# Patient Record
Sex: Female | Born: 1980 | Hispanic: Yes | Marital: Single | Smoking: Never smoker
Health system: Southern US, Community
[De-identification: ages and names within clinical notes are randomized; demographics above are authoritative.]

## PROBLEM LIST (undated history)

## (undated) ENCOUNTER — Ambulatory Visit: Admission: EM | Source: Home / Self Care

## (undated) HISTORY — PX: CHOLECYSTECTOMY: SHX55

---

## 2009-03-06 ENCOUNTER — Emergency Department: Payer: Self-pay | Admitting: Unknown Physician Specialty

## 2010-02-17 ENCOUNTER — Emergency Department: Payer: Self-pay | Admitting: Unknown Physician Specialty

## 2010-11-17 IMAGING — US US OB < 14 WEEKS - US OB TV
1 series · 17 of 28 positions shown · non-contrast
Comparison: none

REASON FOR EXAM: painless vaginal bleeding, h/o placenta previa with
previous pregnancy
COMMENTS:

[Series 1: us ob < 14 weeks - us ob tv · 17 of 40 slices shown]
[im 1/40]
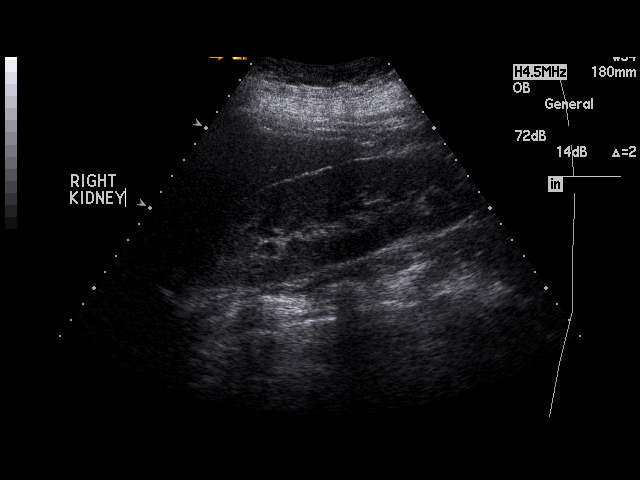
[im 3/40]
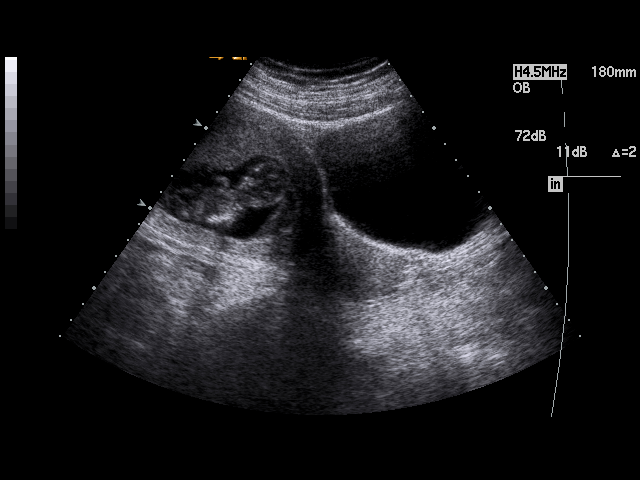
[im 6/40]
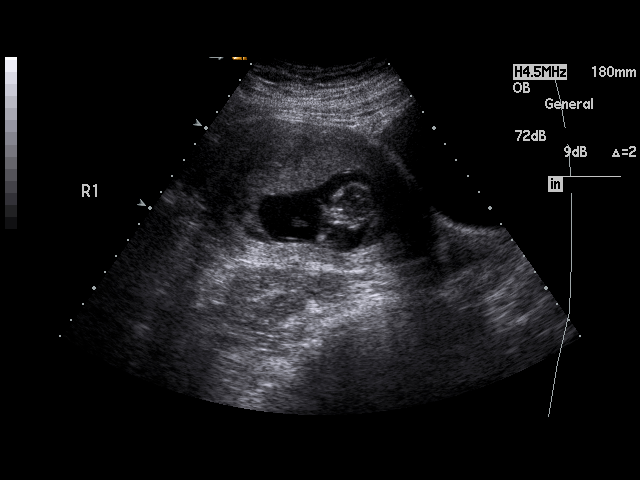
[im 8/40]
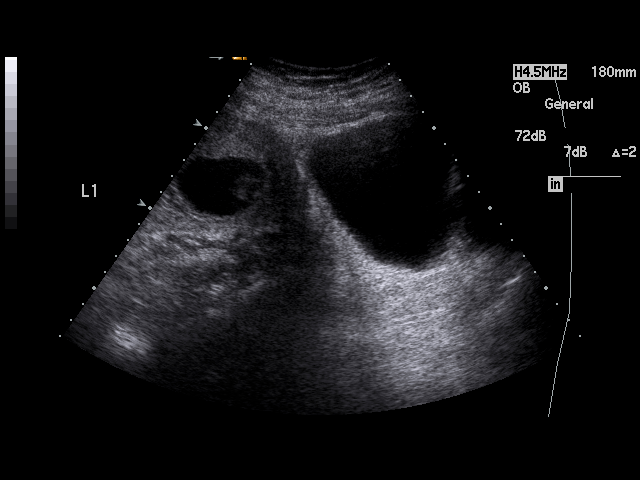
[im 11/40]
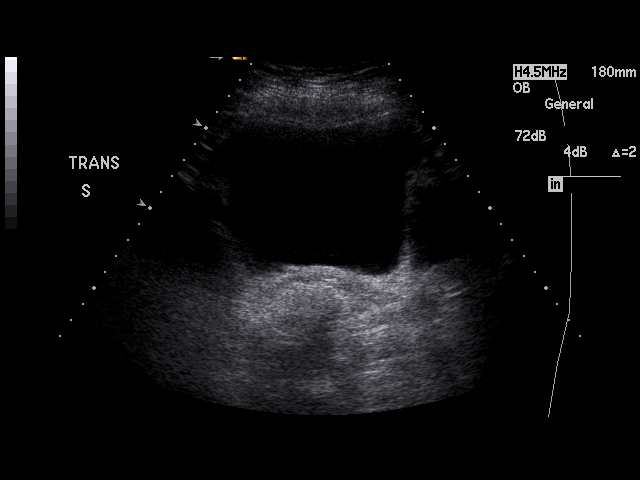
[im 14/40]
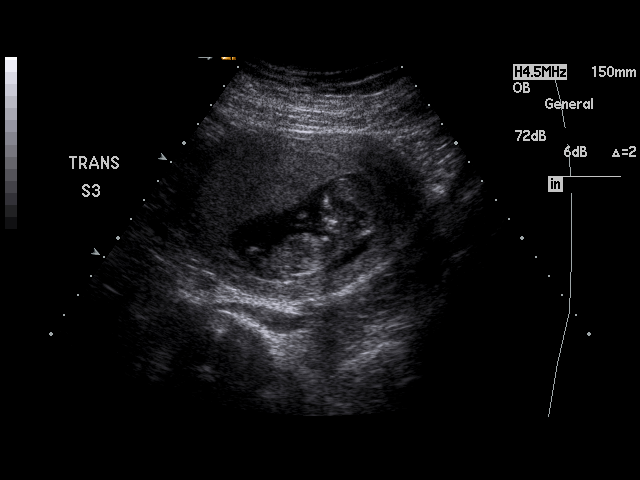
[im 15/40]
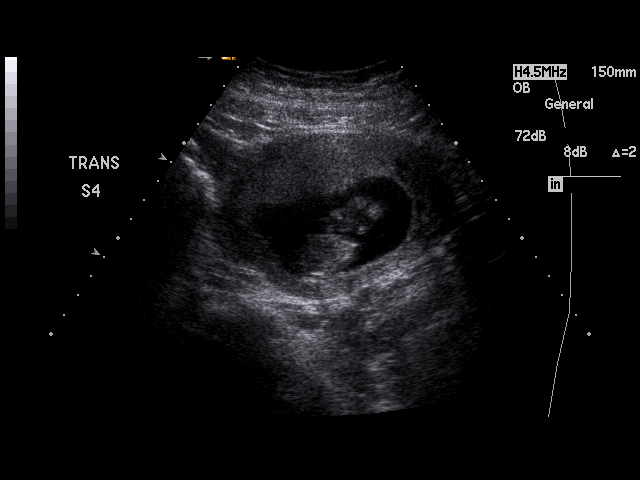
[im 18/40]
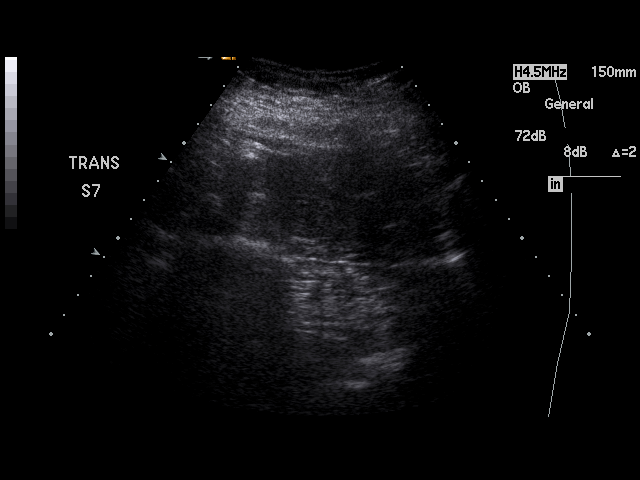
[im 21/40]
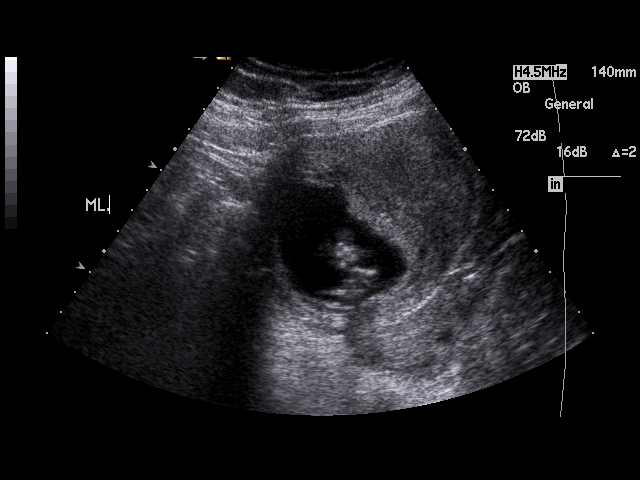
[im 22/40]
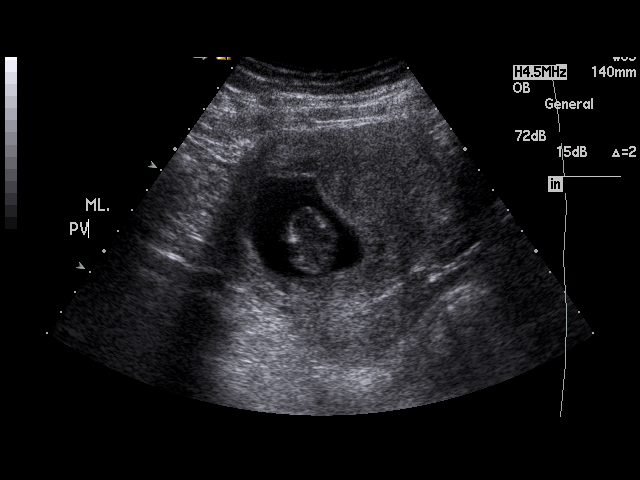
[im 25/40]
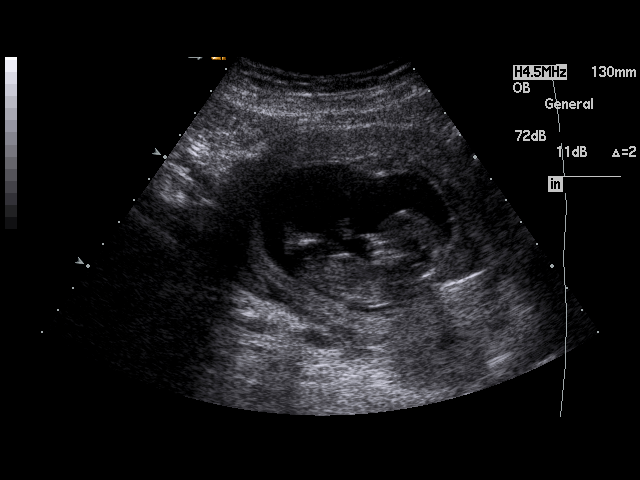
[im 27/40]
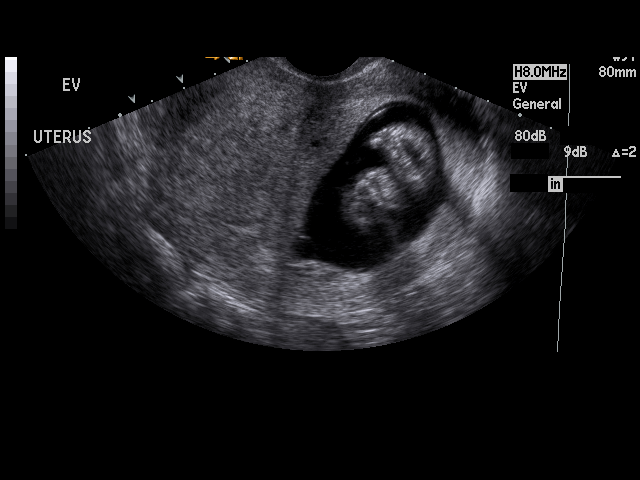
[im 29/40]
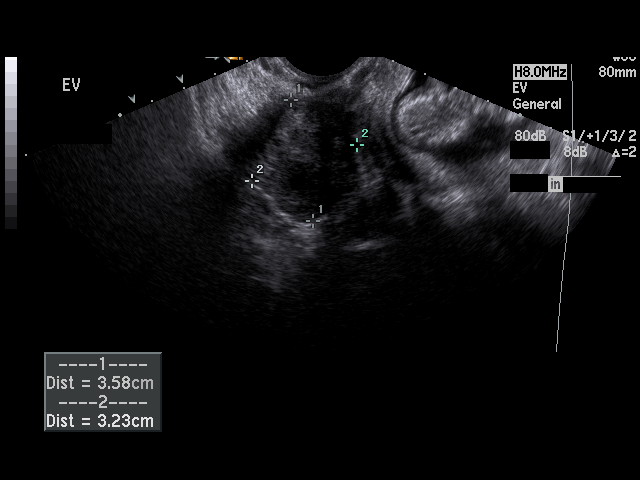
[im 32/40]
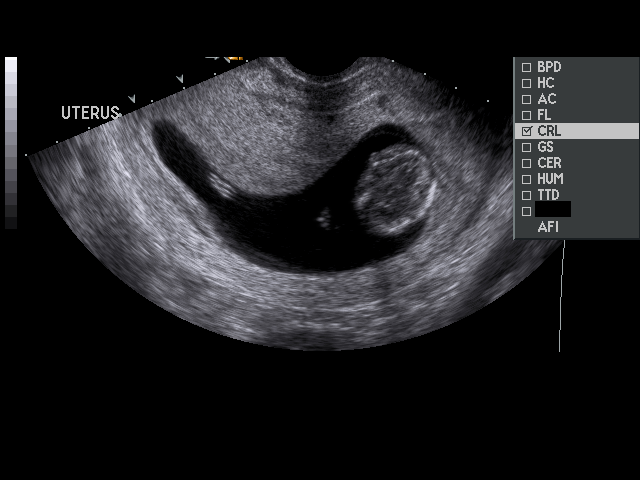
[im 34/40]
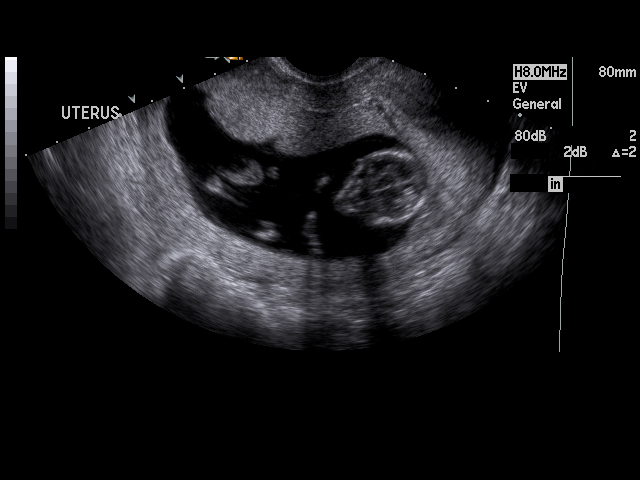
[im 37/40]
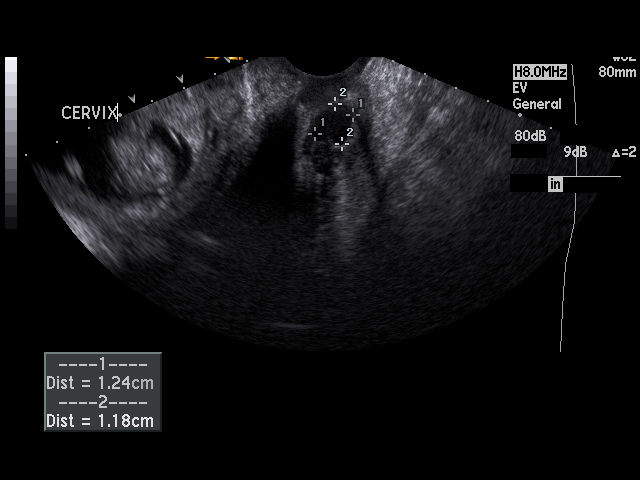
[im 40/40]
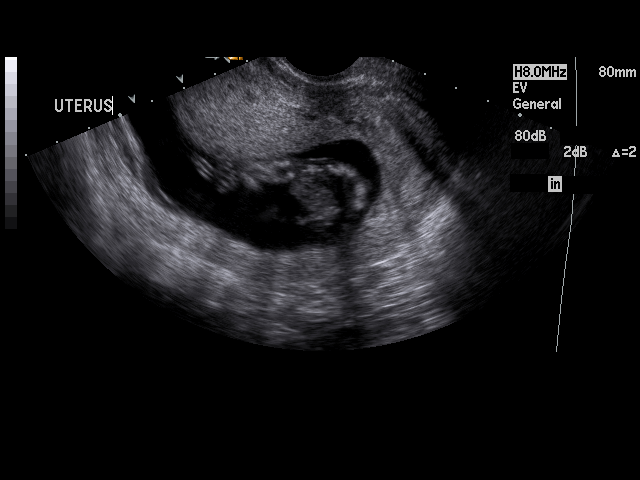

[17 of 28 positions shown; findings below may reference images not displayed]

PROCEDURE:     US  - US OB LESS THAN 14 WEEKS/W TRANS  - February 17, 2010  [DATE]

RESULT:     There is a gravid uterus present. The crown-rump length measures
6.04 cm corresponding to a 12 week 4 day gestation. The echotexture of the
uterus exhibits no evidence of subchorionic hemorrhage. The cardiac rate was
160 beats per minute The left ovary could not be demonstrated. The right
ovaries normal in appearance at 3.6 x 3.6 x 3 cm in diameter. A nabothian
cyst was demonstrated in the cervix.
IMPRESSION: There is a viable IUP with estimated gestational age of 12
weeks 4 days. The estimated date of confinement is 28 August, 2010. No
abnormalities were identified. A cardiac rate of 160 beats per minute was
demonstrated.

A preliminary report was sent to the [HOSPITAL] the conclusion
of the study.

## 2012-04-02 ENCOUNTER — Emergency Department: Payer: Self-pay | Admitting: *Deleted

## 2012-04-02 LAB — CBC
HCT: 40 % (ref 35.0–47.0)
MCH: 27.1 pg (ref 26.0–34.0)
MCV: 84 fL (ref 80–100)
RBC: 4.76 10*6/uL (ref 3.80–5.20)
WBC: 12.2 10*3/uL — ABNORMAL HIGH (ref 3.6–11.0)

## 2012-04-02 LAB — URINALYSIS, COMPLETE
Bacteria: NONE SEEN
Bilirubin,UR: NEGATIVE
Glucose,UR: NEGATIVE mg/dL (ref 0–75)
Ketone: NEGATIVE
Nitrite: NEGATIVE
Ph: 6 (ref 4.5–8.0)
Protein: NEGATIVE
RBC,UR: 1 /HPF (ref 0–5)
Specific Gravity: 1.013 (ref 1.003–1.030)
Squamous Epithelial: 4
WBC UR: 7 /HPF (ref 0–5)

## 2012-04-02 LAB — COMPREHENSIVE METABOLIC PANEL
Albumin: 3.6 g/dL (ref 3.4–5.0)
Alkaline Phosphatase: 100 U/L (ref 50–136)
Anion Gap: 9 (ref 7–16)
BUN: 16 mg/dL (ref 7–18)
Bilirubin,Total: 0.1 mg/dL — ABNORMAL LOW (ref 0.2–1.0)
Co2: 27 mmol/L (ref 21–32)
Creatinine: 0.58 mg/dL — ABNORMAL LOW (ref 0.60–1.30)
EGFR (African American): >60
Glucose: 88 mg/dL (ref 65–99)
Potassium: 3.9 mmol/L (ref 3.5–5.1)
SGOT(AST): 15 U/L (ref 15–37)
Sodium: 143 mmol/L (ref 136–145)

## 2012-04-02 LAB — PREGNANCY, URINE: Pregnancy Test, Urine: NEGATIVE m[IU]/mL

## 2012-04-02 LAB — LIPASE, BLOOD: Lipase: 123 U/L (ref 73–393)

## 2012-05-21 ENCOUNTER — Emergency Department: Payer: Self-pay | Admitting: Emergency Medicine

## 2012-08-08 ENCOUNTER — Emergency Department: Payer: Self-pay | Admitting: Emergency Medicine

## 2012-08-08 LAB — BASIC METABOLIC PANEL
BUN: 9 mg/dL (ref 7–18)
Chloride: 108 mmol/L — ABNORMAL HIGH (ref 98–107)
Co2: 23 mmol/L (ref 21–32)
Creatinine: 0.5 mg/dL — ABNORMAL LOW (ref 0.60–1.30)
EGFR (African American): >60
EGFR (Non-African Amer.): >60
Glucose: 80 mg/dL (ref 65–99)

## 2012-08-08 LAB — URINALYSIS, COMPLETE
Bilirubin,UR: NEGATIVE
Glucose,UR: NEGATIVE mg/dL (ref 0–75)
Ketone: NEGATIVE
Ph: 7 (ref 4.5–8.0)
Squamous Epithelial: 16

## 2012-08-08 LAB — CBC
HCT: 40.6 % (ref 35.0–47.0)
HGB: 13.5 g/dL (ref 12.0–16.0)
RBC: 4.85 10*6/uL (ref 3.80–5.20)
WBC: 8.8 10*3/uL (ref 3.6–11.0)

## 2012-08-08 LAB — HCG, QUANTITATIVE, PREGNANCY: Beta Hcg, Quant.: 47190 m[IU]/mL — ABNORMAL HIGH

## 2012-11-26 ENCOUNTER — Observation Stay: Payer: Self-pay

## 2013-02-08 ENCOUNTER — Observation Stay: Payer: Self-pay | Admitting: Obstetrics and Gynecology

## 2013-02-08 LAB — PIH PROFILE
BUN: 7 mg/dL (ref 7–18)
Chloride: 108 mmol/L — ABNORMAL HIGH (ref 98–107)
Co2: 23 mmol/L (ref 21–32)
Glucose: 74 mg/dL (ref 65–99)
HCT: 37.2 % (ref 35.0–47.0)
HGB: 12 g/dL (ref 12.0–16.0)
MCH: 26.7 pg (ref 26.0–34.0)
MCHC: 32.3 g/dL (ref 32.0–36.0)
MCV: 83 fL (ref 80–100)
Osmolality: 272 (ref 275–301)
Platelet: 256 10*3/uL (ref 150–440)
SGOT(AST): 16 U/L (ref 15–37)
Uric Acid: 4 mg/dL (ref 2.6–6.0)

## 2013-02-20 ENCOUNTER — Observation Stay: Payer: Self-pay | Admitting: Obstetrics and Gynecology

## 2013-02-28 DIAGNOSIS — Z3400 Encounter for supervision of normal first pregnancy, unspecified trimester: Secondary | ICD-10-CM | POA: Insufficient documentation

## 2013-02-28 DIAGNOSIS — I10 Essential (primary) hypertension: Secondary | ICD-10-CM | POA: Insufficient documentation

## 2013-05-21 ENCOUNTER — Emergency Department: Payer: Self-pay | Admitting: Emergency Medicine

## 2015-02-24 NOTE — H&P (Signed)
L&D Evaluation:  History:  HPI 34yo [id number] with EDD of 6/614 [facility] here due to "URI today and presented to ER with c/o "have not felt baby move". Pt has congestion, cough,fever. Temp 98 now. No N<V<D<, vag bleeding, ROM, UC's.   Presents with decreased fetal movement, URI   Patient's Medical History No Chronic Illness   Patient's Surgical History none   Medications Pre Natal Vitamins   Allergies NKDA   Social History none   Family History Non-Contributory   ROS:  ROS All systems were reviewed.  HEENT, CNS, GI, GU, Respiratory, CV, Renal and Musculoskeletal systems were found to be normal.   Exam:  Vital Signs stable  T98 P-101-20 BP 138/75   General no apparent distress   Mental Status clear   Chest clear   Heart normal sinus rhythm, no murmur/gallop/rubs   Estimated Fetal Weight Average for gestational age   Back no CVAT   Reflexes 0   Mebranes Intact   FHT normal rate with no decels, +FHR   Ucx absent   Skin dry   Lymph no lymphadenopathy   Other HEENT: neg tenderness of max/frontal sinuses. Cerumen in the ear canals. +ant cervical nodes bilat. Throat:+cobblestonning, no exudate. Lungs CTA, no W/W/R. CV:S1S2,RRR,No M/R/G. HQI:ONGEXBAbd:Gravid.   Impression:  Impression IUP at 23 3/7 weeks wtih URI   Plan:  Plan See below   Comments P: Hydration at home with po fluids. OTC Mucinex, OTC Robitussin CF, Hall's cough drops prn. Rest. FU at next appt or sooner if worsening of S/S.   Electronic Signatures: Sharee PimpleJones, Caron W (CNM)  (Signed 10-Feb-14 13:21)  Authored: L&D Evaluation   Last Updated: 10-Feb-14 13:21 by Sharee PimpleJones, Caron W (CNM)

## 2019-05-16 ENCOUNTER — Other Ambulatory Visit: Payer: Self-pay | Admitting: Internal Medicine

## 2019-05-16 DIAGNOSIS — Z20822 Contact with and (suspected) exposure to covid-19: Secondary | ICD-10-CM

## 2019-05-19 LAB — NOVEL CORONAVIRUS, NAA: SARS-CoV-2, NAA: NOT DETECTED

## 2019-08-21 ENCOUNTER — Other Ambulatory Visit: Payer: Self-pay | Admitting: *Deleted

## 2019-08-21 DIAGNOSIS — Z20822 Contact with and (suspected) exposure to covid-19: Secondary | ICD-10-CM

## 2019-08-22 LAB — NOVEL CORONAVIRUS, NAA: SARS-CoV-2, NAA: NOT DETECTED

## 2020-11-24 ENCOUNTER — Emergency Department: Payer: Self-pay

## 2020-11-24 ENCOUNTER — Encounter: Payer: Self-pay | Admitting: *Deleted

## 2020-11-24 ENCOUNTER — Emergency Department
Admission: EM | Admit: 2020-11-24 | Discharge: 2020-11-24 | Disposition: A | Discharge status: Home / Self Care | Payer: Self-pay | Attending: Emergency Medicine | Admitting: Emergency Medicine

## 2020-11-24 ENCOUNTER — Other Ambulatory Visit: Payer: Self-pay

## 2020-11-24 DIAGNOSIS — Y93G1 Activity, food preparation and clean up: Secondary | ICD-10-CM | POA: Insufficient documentation

## 2020-11-24 DIAGNOSIS — Z23 Encounter for immunization: Secondary | ICD-10-CM | POA: Insufficient documentation

## 2020-11-24 DIAGNOSIS — W260XXA Contact with knife, initial encounter: Secondary | ICD-10-CM | POA: Insufficient documentation

## 2020-11-24 DIAGNOSIS — S61311A Laceration without foreign body of left index finger with damage to nail, initial encounter: Secondary | ICD-10-CM | POA: Insufficient documentation

## 2020-11-24 MED ORDER — CEPHALEXIN 500 MG PO CAPS: 1000.0000 mg | ORAL_CAPSULE | Freq: Two times a day (BID) | ORAL | 0 refills | Status: DC

## 2020-11-24 MED ORDER — OXYCODONE-ACETAMINOPHEN 5-325 MG PO TABS
1.0000 | ORAL_TABLET | Freq: Once | ORAL | Status: AC
  Administered 2020-11-24: 1 via ORAL
  Filled 2020-11-24: qty 1

## 2020-11-24 MED ORDER — TETANUS-DIPHTH-ACELL PERTUSSIS 5-2.5-18.5 LF-MCG/0.5 IM SUSY
0.5000 mL | PREFILLED_SYRINGE | Freq: Once | INTRAMUSCULAR | Status: AC
  Administered 2020-11-24: 0.5 mL via INTRAMUSCULAR
  Filled 2020-11-24: qty 0.5

## 2020-11-24 NOTE — ED Triage Notes (Signed)
Pt to ED reporting an accidental laceration to the left pointer finger while cooking. Pt was cutting onions but otherwise reports it was a clean knife. Laceration occurred 2.5 hours ago and pt reports she has not been able to control the bleeding at home.   RN unwrapped finger in triage and it appears pt has cut the tip of her finger off. Unable to assess if there is bone showing but it appears to have been cut off in the patients nail bed. No arterial bleed noted. Pt reports having left the "other piece" of her finger at home.  Wound redressed.

## 2020-11-24 NOTE — ED Notes (Signed)
Wound cleaned and dressed by pa-c d/c inst to pt.

## 2020-11-24 NOTE — ED Provider Notes (Signed)
[facility] Emergency Department Provider Note  ____________________________________________  Time seen: Approximately 9:55 PM  I have reviewed the triage vital signs and the nursing notes.   HISTORY  Chief Complaint Laceration    HPI Claudia Crawford is a 40 y.o. female who presents the emergency department for evaluation  of laceration to the index finger of the left hand.  Patient was using a knife to chop food to make a salad when the knife made contact with the fingernail.  Patient sustained a laceration/avulsion to the distal aspect of the index finger.  Fingernail has been removed due to the laceration.  No edges appropriate for suturing.  Patient was able to control bleeding with direct pressure.  Intact range of motion.  No other injury or complaint.  Patient states that last tetanus shot was years ago.        History reviewed. No pertinent past medical history.  There are no problems to display for this patient.   History reviewed. No pertinent surgical history.  Prior to Admission medications   Medication Sig Start Date End Date Taking? Authorizing Provider  cephALEXin (KEFLEX) 500 MG capsule Take 2 capsules (1,000 mg total) by mouth 2 (two) times daily. 11/24/20  Yes Harlie Buening, Delorise Royals, PA-C    Allergies Patient has no known allergies.  History reviewed. No pertinent family history.  Social History Social History   Tobacco Use  . Smoking status: Never Smoker  . Smokeless tobacco: Never Used     Review of Systems  Constitutional: No fever/chills Eyes: No visual changes. No discharge ENT: No upper respiratory complaints. Cardiovascular: no chest pain. Respiratory: no cough. No SOB. Gastrointestinal: No abdominal pain.  No nausea, no vomiting.  No diarrhea.  No constipation. Musculoskeletal: Laceration involving fingernail to the index finger of the left hand Skin: Negative for rash, abrasions, lacerations,  ecchymosis. Neurological: Negative for headaches, focal weakness or numbness.  10 System ROS otherwise negative.  ____________________________________________   PHYSICAL EXAM:  VITAL SIGNS: ED Triage Vitals [11/24/20 2050]  Enc Vitals Group     BP (!) 171/98     Pulse Rate 71     Resp 16     Temp 98.3 F (36.8 C)     Temp Source Oral     SpO2 98 %     Weight 220 lb (99.8 kg)     Height 5\' 6"  (1.676 m)     Head Circumference      Peak Flow      Pain Score 5     Pain Loc      Pain Edu?      Excl. in GC?      Constitutional: Alert and oriented. Well appearing and in no acute distress. Eyes: Conjunctivae are normal. PERRL. EOMI. Head: Atraumatic. ENT:      Ears:       Nose: No congestion/rhinnorhea.      Mouth/Throat: Mucous membranes are moist.  Neck: No stridor.    Cardiovascular: Normal rate, regular rhythm. Normal S1 and S2.  Good peripheral circulation. Respiratory: Normal respiratory effort without tachypnea or retractions. Lungs CTAB. Good air entry to the bases with no decreased or absent breath sounds. Musculoskeletal: Full range of motion to all extremities. No gross deformities appreciated.  Visualization of the index finger left hand reveals avulsion type laceration to the distal aspect.  Fingernail is missing at this time.  Patient has good range of motion at this time with good flexion and extension.  Bleeding is controlled.  No other signs of trauma to the left hand Neurologic:  Normal speech and language. No gross focal neurologic deficits are appreciated.  Skin:  Skin is warm, dry and intact. No rash noted. Psychiatric: Mood and affect are normal. Speech and behavior are normal. Patient exhibits appropriate insight and judgement.   ____________________________________________   LABS (all labs ordered are listed, but only abnormal results are displayed)  Labs Reviewed - No data to  display ____________________________________________  EKG   ____________________________________________  RADIOLOGY I personally viewed and evaluated these images as part of my medical decision making, as well as reviewing the written report by the radiologist.  ED Provider Interpretation: No osseous involvement.  No retained foreign body.  DG Finger Index Left  Result Date: 11/24/2020 CLINICAL DATA:  Injury. EXAM: LEFT INDEX FINGER 2+V COMPARISON:  None. FINDINGS: There is soft tissue swelling about the second digit. There is no acute displaced fracture or dislocation. No radiopaque foreign body. Exam was limited by an overlying bandage. IMPRESSION: Soft tissue swelling without acute displaced fracture or dislocation. Electronically Signed   By: Katherine Mantle M.D.   On: 11/24/2020 21:12    ____________________________________________    PROCEDURES  Procedure(s) performed:    Marland KitchenMarland KitchenLaceration Repair  Date/Time: 11/24/2020 11:22 PM Performed by: Racheal Patches, PA-C Authorized by: Racheal Patches, PA-C   Consent:    Consent obtained:  Verbal   Consent given by:  Patient   Risks discussed:  Infection, pain, poor cosmetic result, poor wound healing and need for additional repair Universal protocol:    Procedure explained and questions answered to patient or proxy's satisfaction: yes     Patient identity confirmed:  Verbally with patient Anesthesia:    Anesthesia method:  None Laceration details:    Location:  Finger   Finger location:  L index finger Exploration:    Hemostasis achieved with:  Direct pressure   Wound exploration: wound explored through full range of motion and entire depth of wound visualized     Wound extent: no foreign bodies/material noted, no nerve damage noted, no tendon damage noted and no underlying fracture noted     Contaminated: no   Treatment:    Area cleansed with:  Povidone-iodine and saline   Amount of cleaning:  Extensive    Irrigation solution:  Sterile saline   Irrigation volume:  1L   Debridement:  None Approximation:    Approximation:  Loose Repair type:    Repair type:  Simple Post-procedure details:    Dressing:  Bulky dressing and splint for protection   Procedure completion:  Tolerated well, no immediate complications Comments:     Patient had avulsion type laceration with missing fingernail to the index finger.  Patient had bleeding controlled.  Full range of motion.  No visible foreign body.  No underlying fracture.  Area was thoroughly cleansed using saline and Betadine.  Surgicel dressing placed over laceration and wrapped with tube gauze, splint for protection.      Medications  oxyCODONE-acetaminophen (PERCOCET/ROXICET) 5-325 MG per tablet 1 tablet (1 tablet Oral Given 11/24/20 2213)  Tdap (BOOSTRIX) injection 0.5 mL (0.5 mLs Intramuscular Given 11/24/20 2213)     ____________________________________________   INITIAL IMPRESSION / ASSESSMENT AND PLAN / ED COURSE  Pertinent labs & imaging results that were available during my care of the patient were reviewed by me and considered in my medical decision making (see chart for details).  Review of the [place] CSRS was performed in accordance  of the [facility] prior to dispensing any controlled drugs.           Patient's diagnosis is consistent with finger laceration.  Patient presented to the emergency department after having an avulsion type laceration to the index finger.  Patient had bleeding controlled.  Tetanus shot was updated at this time.  Edges are not able to be approximated due to the type of injury and as such area is thoroughly cleansed, Surgicel dressing placed with splint for protection as described above.  Patient tolerated well.  Antibiotics prophylactically.  Follow-up with hand surgery to ensure appropriate healing.  Return precautions discussed with the patient..Patient is given ED precautions to return to the ED for any worsening or  new symptoms.     ____________________________________________  FINAL CLINICAL IMPRESSION(S) / ED DIAGNOSES  Final diagnoses:  Laceration of left index finger without foreign body with damage to nail, initial encounter      NEW MEDICATIONS STARTED DURING THIS VISIT:  ED Discharge Orders         Ordered    cephALEXin (KEFLEX) 500 MG capsule  2 times daily        11/24/20 2242              This chart was dictated using voice recognition software/Dragon. Despite best efforts to proofread, errors can occur which can change the meaning. Any change was purely unintentional.    Racheal Patches, PA-C 11/24/20 2324    Delton Prairie, MD 11/24/20 [phone]

## 2020-11-24 NOTE — ED Notes (Signed)
Pt has avulsion/laceration to left index finger.  Cut with a knife in the kitchen tonight.  Bleeding controlled with gauze and pressure dressing.  Pa-c in room with pt now.

## 2021-01-12 ENCOUNTER — Other Ambulatory Visit: Payer: Self-pay

## 2021-01-12 ENCOUNTER — Encounter: Payer: Self-pay | Admitting: Emergency Medicine

## 2021-01-12 ENCOUNTER — Ambulatory Visit
Admission: EM | Admit: 2021-01-12 | Discharge: 2021-01-12 | Disposition: A | Discharge status: Home / Self Care | Payer: Self-pay | Attending: Family Medicine | Admitting: Family Medicine

## 2021-01-12 DIAGNOSIS — F410 Panic disorder [episodic paroxysmal anxiety] without agoraphobia: Secondary | ICD-10-CM

## 2021-01-12 MED ORDER — ONDANSETRON 4 MG PO TBDP: 4.0000 mg | ORAL_TABLET | Freq: Three times a day (TID) | ORAL | 0 refills | Status: DC | PRN

## 2021-01-12 MED ORDER — HYDROXYZINE HCL 25 MG PO TABS: 25.0000 mg | ORAL_TABLET | Freq: Three times a day (TID) | ORAL | 0 refills | Status: AC | PRN

## 2021-01-12 NOTE — ED Triage Notes (Addendum)
Patient in today c/o sudden onset of sob, weakness and emesis today at work. Patient states she couldn't catch her breath. Patient states her symptoms lasted ~45 minutes.   Patient drank some coke while at work and vomited it back up. Patient states she feels very shaky now.

## 2021-01-12 NOTE — Discharge Instructions (Signed)
Rest.  Medication as needed.  Take care  Dr. Delanie Tirrell  

## 2021-01-12 NOTE — ED Provider Notes (Signed)
[facility]    CSN: [id number] Arrival date & time: 01/12/21  1239      History   Chief Complaint Chief Complaint  Patient presents with  . Shortness of Breath  . Emesis  . Weakness   HPI  40 year old female presents with multiple complaints.  Patient states that she was not feeling well when she was at work.  She developed sudden onset shortness of breath.  She states that she felt shaky and then subsequent was given some Coca-Cola.  She states that she subsequently had emesis.  She currently feels very shaky and is crying and appears anxious.  Symptoms have improved.  Patient notes stress.  No fever.  No respiratory symptoms.  No other complaints or concerns at this time.  Past Surgical History:  Procedure Laterality Date  . CHOLECYSTECTOMY      OB History   No obstetric history on file.      Home Medications    Prior to Admission medications   Medication Sig Start Date End Date Taking? Authorizing Provider  hydrOXYzine (ATARAX/VISTARIL) 25 MG tablet Take 1 tablet (25 mg total) by mouth every 8 (eight) hours as needed. 01/12/21  Yes Veatrice Eckstein, Verdis Frederickson, DO  levonorgestrel (MIRENA) 20 MCG/24HR IUD 1 each by Intrauterine route once.   Yes [provider]  ondansetron (ZOFRAN ODT) 4 MG disintegrating tablet Take 1 tablet (4 mg total) by mouth every 8 (eight) hours as needed for nausea or vomiting. 01/12/21  Yes Tommie Sams, DO   Social History Social History   Tobacco Use  . Smoking status: Never Smoker  . Smokeless tobacco: Never Used  Vaping Use  . Vaping Use: Never used  Substance Use Topics  . Alcohol use: Yes    Comment: social  . Drug use: Never     Allergies   Patient has no known allergies.   Review of Systems Review of Systems Per HPI  Physical Exam Triage Vital Signs ED Triage Vitals  Enc Vitals Group     BP 01/12/21 1312 (!) 173/121     Pulse Rate 01/12/21 1312 (!) 101     Resp 01/12/21 1312 18     Temp 01/12/21 1312 99  F (37.2 C)     Temp Source 01/12/21 1312 Oral     SpO2 01/12/21 1312 100 %     Weight 01/12/21 1308 210 lb (95.3 kg)     Height 01/12/21 1308 5\' 6"  (1.676 m)     Head Circumference --      Peak Flow --      Pain Score 01/12/21 1308 0     Pain Loc --      Pain Edu? --      Excl. in GC? --    Updated Vital Signs BP (!) 162/112 (BP Location: Right Arm)   Pulse (!) 101   Temp 99 F (37.2 C) (Oral)   Resp 18   Ht 5\' 6"  (1.676 m)   Wt 95.3 kg   SpO2 100%   BMI 33.89 kg/m   Visual Acuity Right Eye Distance:   Left Eye Distance:   Bilateral Distance:    Right Eye Near:   Left Eye Near:    Bilateral Near:     Physical Exam Vitals and nursing note reviewed.  Constitutional:      General: She is not in acute distress.    Appearance: Normal appearance. She is not ill-appearing.  HENT:     Head: Normocephalic and  atraumatic.  Eyes:     General:        Right eye: No discharge.        Left eye: No discharge.     Conjunctiva/sclera: Conjunctivae normal.  Cardiovascular:     Rate and Rhythm: Regular rhythm. Tachycardia present.     Heart sounds: No murmur heard.   Pulmonary:     Effort: Pulmonary effort is normal.     Breath sounds: Normal breath sounds. No wheezing, rhonchi or rales.  Neurological:     Mental Status: She is alert.  Psychiatric:        Mood and Affect: Mood normal.        Behavior: Behavior normal.    UC Treatments / Results  Labs (all labs ordered are listed, but only abnormal results are displayed) Labs Reviewed - No data to display  EKG   Radiology No results found.  Procedures Procedures (including critical care time)  Medications Ordered in UC Medications - No data to display  Initial Impression / Assessment and Plan / UC Course  I have reviewed the triage vital signs and the nursing notes.  Pertinent labs & imaging results that were available during my care of the patient were reviewed by me and considered in my medical decision  making (see chart for details).    40 year old female presents with clinical picture consistent with panic attack.  Zofran and Atarax as needed.  Supportive care.  Final Clinical Impressions(s) / UC Diagnoses   Final diagnoses:  Panic attack     Discharge Instructions     Rest.  Medication as needed.  Take care  Dr. Adriana Simas    ED Prescriptions    Medication Sig Dispense Auth. Provider   ondansetron (ZOFRAN ODT) 4 MG disintegrating tablet Take 1 tablet (4 mg total) by mouth every 8 (eight) hours as needed for nausea or vomiting. 20 tablet Ardean Simonich G, DO   hydrOXYzine (ATARAX/VISTARIL) 25 MG tablet Take 1 tablet (25 mg total) by mouth every 8 (eight) hours as needed. 30 tablet Tommie Sams, DO     PDMP not reviewed this encounter.   Tommie Sams, [place] 01/12/21 1417

## 2021-08-24 IMAGING — CR DG FINGER INDEX 2+V*L*
1 series · 3 of 3 positions shown · non-contrast
Comparison: None.

CLINICAL DATA: Injury.

EXAM:
LEFT INDEX FINGER 2+V

[Series 1: dg finger index left · 0.14mm/px · 3 of 3 slices shown]
[im 1/3]
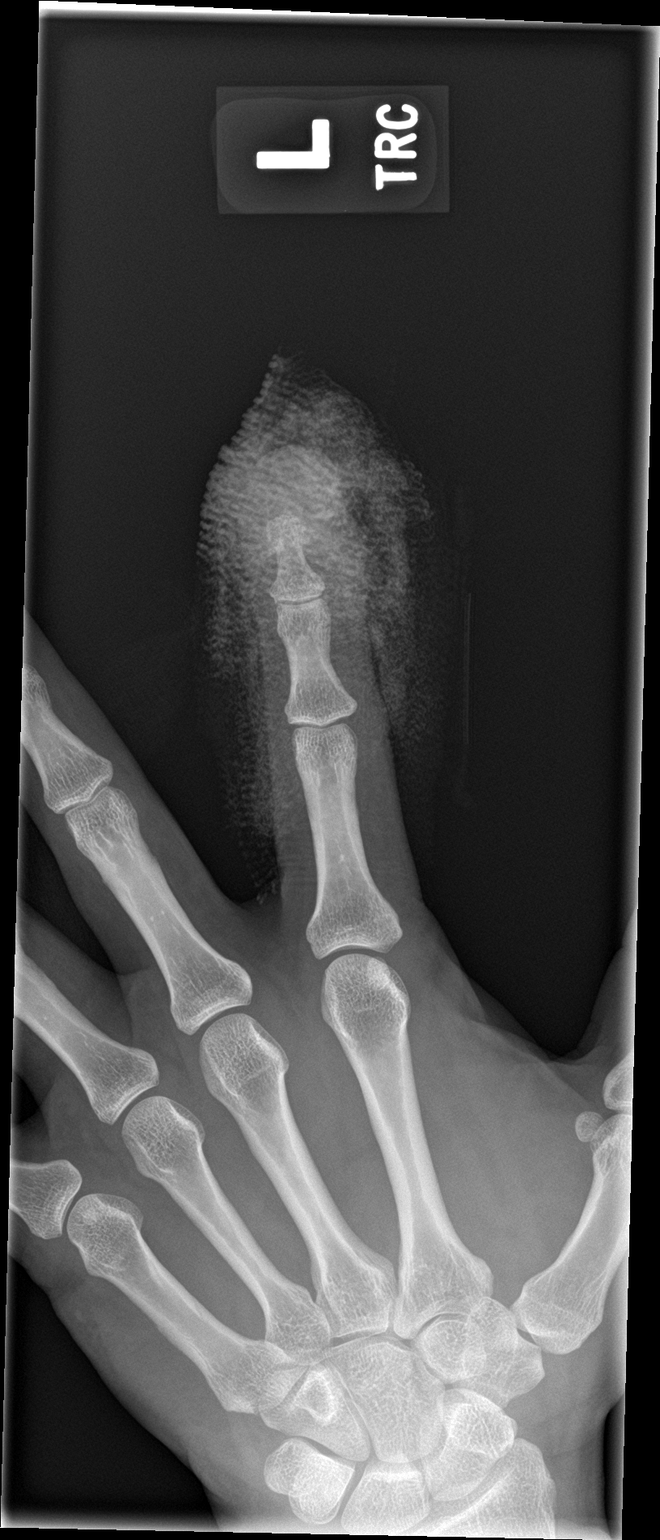
[im 2/3]
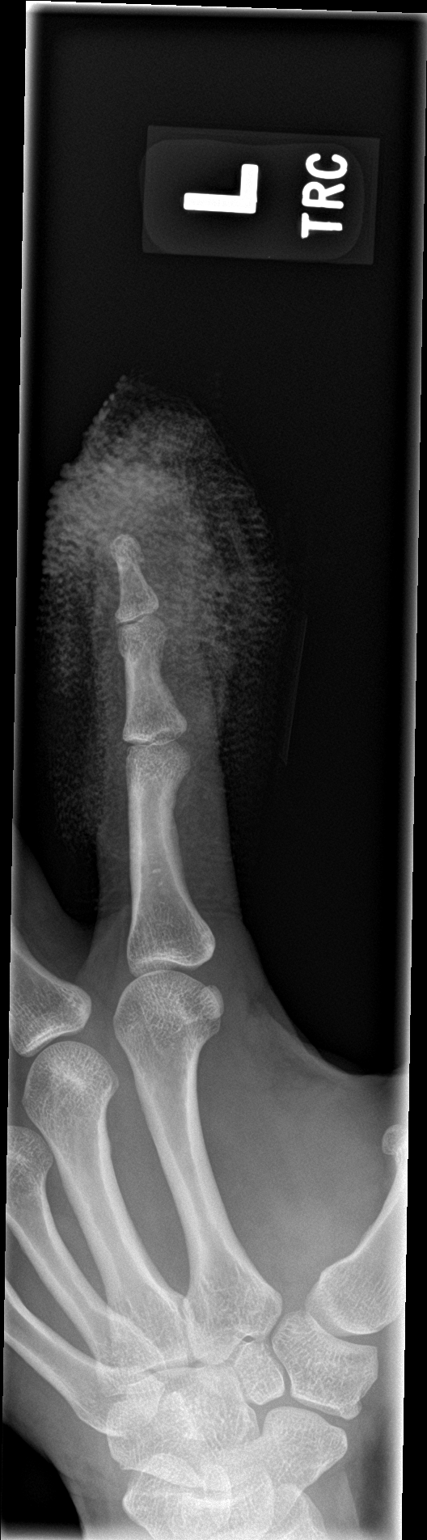
[im 3/3]
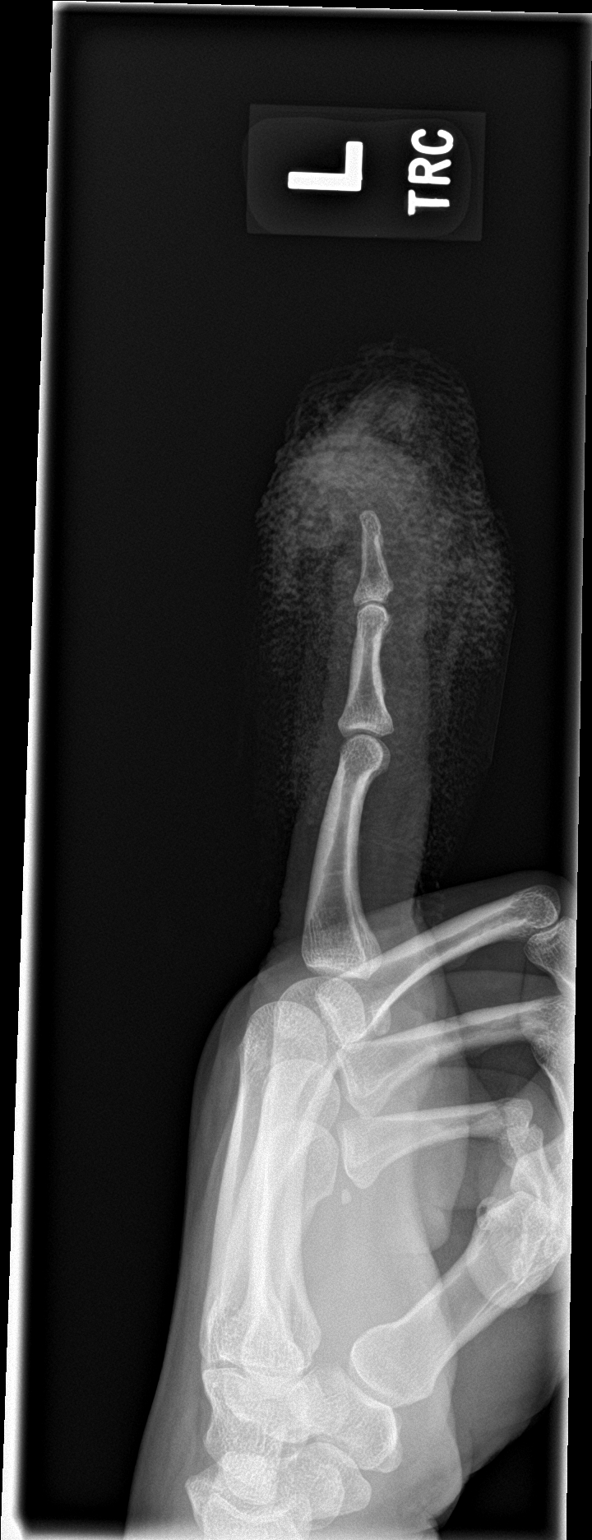

[3 of 3 positions shown; findings below may reference images not displayed]

FINDINGS: There is soft tissue swelling about the second digit. There is no
acute displaced fracture or dislocation. No radiopaque foreign body.
Exam was limited by an overlying bandage.
IMPRESSION: Soft tissue swelling without acute displaced fracture or
dislocation.

## 2021-11-18 LAB — RESULTS CONSOLE HPV: CHL HPV: NEGATIVE

## 2021-11-18 LAB — HM PAP SMEAR: HM Pap smear: NORMAL

## 2022-11-16 ENCOUNTER — Emergency Department: Payer: 59

## 2022-11-16 ENCOUNTER — Emergency Department
Admission: EM | Admit: 2022-11-16 | Discharge: 2022-11-16 | Disposition: A | Discharge status: Home / Self Care | Payer: 59 | Attending: Emergency Medicine | Admitting: Emergency Medicine

## 2022-11-16 ENCOUNTER — Other Ambulatory Visit: Payer: Self-pay

## 2022-11-16 DIAGNOSIS — G510 Bell's palsy: Secondary | ICD-10-CM | POA: Diagnosis not present

## 2022-11-16 DIAGNOSIS — R200 Anesthesia of skin: Secondary | ICD-10-CM | POA: Diagnosis present

## 2022-11-16 LAB — COMPREHENSIVE METABOLIC PANEL
ALT: 137 U/L — ABNORMAL HIGH (ref 0–44)
AST: 93 U/L — ABNORMAL HIGH (ref 15–41)
Albumin: 3.6 g/dL (ref 3.5–5.0)
Alkaline Phosphatase: 108 U/L (ref 38–126)
Anion gap: 8 (ref 5–15)
BUN: 14 mg/dL (ref 6–20)
CO2: 23 mmol/L (ref 22–32)
Calcium: 8.4 mg/dL — ABNORMAL LOW (ref 8.9–10.3)
Chloride: 105 mmol/L (ref 98–111)
Creatinine, Ser: 0.6 mg/dL (ref 0.44–1.00)
GFR, Estimated: >60 mL/min (ref >60)
Glucose, Bld: 136 mg/dL — ABNORMAL HIGH (ref 70–99)
Potassium: 3.2 mmol/L — ABNORMAL LOW (ref 3.5–5.1)
Sodium: 136 mmol/L (ref 135–145)
Total Bilirubin: 0.7 mg/dL (ref 0.3–1.2)
Total Protein: 6.8 g/dL (ref 6.5–8.1)

## 2022-11-16 LAB — DIFFERENTIAL
Abs Immature Granulocytes: 0.04 10*3/uL (ref 0.00–0.07)
Basophils Absolute: 0 10*3/uL (ref 0.0–0.1)
Basophils Relative: 1 %
Eosinophils Absolute: 0.2 10*3/uL (ref 0.0–0.5)
Eosinophils Relative: 3 %
Immature Granulocytes: 1 %
Lymphocytes Relative: 21 %
Lymphs Abs: 1.5 10*3/uL (ref 0.7–4.0)
Monocytes Absolute: 0.5 10*3/uL (ref 0.1–1.0)
Monocytes Relative: 8 %
Neutro Abs: 4.7 10*3/uL (ref 1.7–7.7)
Neutrophils Relative %: 66 %

## 2022-11-16 LAB — CBC
HCT: 42 % (ref 36.0–46.0)
Hemoglobin: 13.8 g/dL (ref 12.0–15.0)
MCH: 27.9 pg (ref 26.0–34.0)
MCHC: 32.9 g/dL (ref 30.0–36.0)
MCV: 85 fL (ref 80.0–100.0)
Platelets: 297 10*3/uL (ref 150–400)
RBC: 4.94 MIL/uL (ref 3.87–5.11)
RDW: 12.4 % (ref 11.5–15.5)
WBC: 6.9 10*3/uL (ref 4.0–10.5)
nRBC: 0 % (ref 0.0–0.2)

## 2022-11-16 LAB — PROTIME-INR
INR: 1.1 (ref 0.8–1.2)
Prothrombin Time: 13.6 seconds (ref 11.4–15.2)

## 2022-11-16 LAB — APTT: aPTT: 28 seconds (ref 24–36)

## 2022-11-16 LAB — ETHANOL: Alcohol, Ethyl (B): <10 mg/dL (ref <10)

## 2022-11-16 MED ORDER — SODIUM CHLORIDE 0.9% FLUSH: 3.0000 mL | Freq: Once | INTRAVENOUS | Status: DC

## 2022-11-16 MED ORDER — IOHEXOL 350 MG/ML SOLN
75.0000 mL | Freq: Once | INTRAVENOUS | Status: AC | PRN
  Administered 2022-11-16: 75 mL via INTRAVENOUS

## 2022-11-16 MED ORDER — HYPROMELLOSE (GONIOSCOPIC) 2.5 % OP SOLN: 1.0000 [drp] | Freq: Four times a day (QID) | OPHTHALMIC | 0 refills | Status: DC

## 2022-11-16 MED ORDER — VALACYCLOVIR HCL 1 G PO TABS: 1000.0000 mg | ORAL_TABLET | Freq: Three times a day (TID) | ORAL | 0 refills | Status: AC

## 2022-11-16 MED ORDER — GADOBUTROL 1 MMOL/ML IV SOLN
9.0000 mL | Freq: Once | INTRAVENOUS | Status: AC | PRN
  Administered 2022-11-16: 9 mL via INTRAVENOUS

## 2022-11-16 MED ORDER — PREDNISONE 20 MG PO TABS: ORAL_TABLET | ORAL | 0 refills | Status: AC

## 2022-11-16 NOTE — ED Notes (Signed)
Pt. In CT. 

## 2022-11-16 NOTE — ED Notes (Signed)
Pt. Up to bathroom indep. Urine specimen obtained.

## 2022-11-16 NOTE — ED Notes (Signed)
Pt. Returned from rad. ?

## 2022-11-16 NOTE — Discharge Instructions (Addendum)
Wear your eye patch, especially at night, to help protect your eye.  Use the artificial tears.  Take the prednisone and Valtrex as prescribed.  Try to take the prednisone in the mornings, with food.

## 2022-11-16 NOTE — ED Triage Notes (Addendum)
Pt comes with c/o left sided facial numbness. Pt states some pain to left side neck also. Pt states she woke up like this. Pt also states little blurry vision to left eye. Pt denies any dizziness.

## 2022-11-16 NOTE — ED Provider Notes (Signed)
[facility] Provider Note    Event Date/Time   First MD Initiated Contact with Patient 11/16/22 1005     (approximate)   History   facial numbness   HPI  Claudia Crawford is a 42 y.o. female here with left facial numbness. Pt reports that for the past week she has had some intermittent left posterior head/neck pain. It has been aching, throbbing, intermittent. She noticed some "tingling" in her left fingertips last night. Today, she noticed that her left eye was watering and her face was numb. She looked in the mirror and noticed her face was asymmetric. Last known normal was last night. No vision changes, neglect, or weakness. No h/o similar issues,.       Physical Exam   Triage Vital Signs: ED Triage Vitals [11/16/22 1003]  Enc Vitals Group     BP (!) 174/115     Pulse Rate (!) 103     Resp 19     Temp 98 F (36.7 C)     Temp src      SpO2 96 %     Weight      Height      Head Circumference      Peak Flow      Pain Score      Pain Loc      Pain Edu?      Excl. in [place]?     Most recent vital signs: Vitals:   11/16/22 1130 11/16/22 1318  BP: (!) 163/107 (!) 179/120  Pulse: 71 76  Resp: (!) 21 18  Temp:    SpO2: 99% 92%     General: Awake, no distress. No  CV:  Good peripheral perfusion. RRR. No murmurs, rubs, gallops. Resp:  Normal effort. Lungs CTAB. Abd:  No distention. No tenderness. Other:  Left facial droop sparing the forehead. Diminished sensation left V1-V3 distributions. Tongue protrusion midline. Uvula is midline. Shoulders elevated b/l. Strength 5/5 bilateral UE and LE. Normal sensation to light touch. Normal FTN.   ED Results / Procedures / Treatments   Labs (all labs ordered are listed, but only abnormal results are displayed) Labs Reviewed  COMPREHENSIVE METABOLIC PANEL - Abnormal; Notable for the following components:      Result Value   Potassium 3.2 (*)    Glucose, Bld 136 (*)    Calcium 8.4 (*)    AST  93 (*)    ALT 137 (*)    All other components within normal limits  PROTIME-INR  APTT  CBC  DIFFERENTIAL  ETHANOL  CBG MONITORING, ED  POC URINE PREG, ED     EKG Normal sinus rhythm, VR 89. PR 142, qRS 82, QTc 450, No acute St elevations or depressions. No ischemia or infarct.   RADIOLOGY CT Head: NAICA MR Brain WWO:  Asymmetric enhancement L facial nerve, likely Bell's palsy   I also independently reviewed and agree with radiologist interpretations.   PROCEDURES:  Critical Care performed: No   MEDICATIONS ORDERED IN ED: Medications  sodium chloride flush (NS) 0.9 % injection 3 mL (3 mLs Intravenous Not Given 11/16/22 1015)  iohexol (OMNIPAQUE) 350 MG/ML injection 75 mL (75 mLs Intravenous Contrast Given 11/16/22 1137)  gadobutrol (GADAVIST) 1 MMOL/ML injection 9 mL (9 mLs Intravenous Contrast Given 11/16/22 1224)     IMPRESSION / MDM / ASSESSMENT AND PLAN / ED COURSE  I reviewed the triage vital signs and the nursing notes.  Differential diagnosis includes, but is not limited to, Bell's, intracranial mass/lesion, Schwannoma, ICH, CVA   Patient's presentation is most consistent with acute presentation with potential threat to life or bodily function.  The patient is on the cardiac monitor to evaluate for evidence of arrhythmia and/or significant heart rate changes.  42 yo F here with left facial droop. Clinically, concern initially for CVA though MRI obtained, reviewed, and is consistent with peripheral Bell's fortunately. No signs of mass, lesion, ischemia, aneurysm. Labs o/w reassuring. No leukocytosis. Mild transasminitis noted - no abd pain, may be underlying fatty liver disease versus viral syndrome.    MRI obtained, reviewed, shows asymmetric hyperenhancement of the left facial nerve consistent with Bell's palsy.  CT angio is unremarkable.  Will treat for Bell's palsy with steroids, Valtrex, eye protection.   FINAL CLINICAL  IMPRESSION(S) / ED DIAGNOSES   Final diagnoses:  Bell's palsy     Rx / DC Orders   ED Discharge Orders          Ordered    predniSONE (DELTASONE) 20 MG tablet  Daily        11/16/22 1320    valACYclovir (VALTREX) 1000 MG tablet  3 times daily        11/16/22 1320    hydroxypropyl methylcellulose / hypromellose (ISOPTO TEARS / GONIOVISC) 2.5 % ophthalmic solution  4 times daily        11/16/22 1320             Note:  This document was prepared using Dragon voice recognition software and may include unintentional dictation errors.   Duffy Bruce, MD 11/16/22 [phone]

## 2023-01-20 ENCOUNTER — Encounter: Payer: Self-pay | Admitting: Emergency Medicine

## 2023-01-20 ENCOUNTER — Ambulatory Visit
Admission: EM | Admit: 2023-01-20 | Discharge: 2023-01-20 | Disposition: A | Discharge status: Home / Self Care | Payer: 59 | Attending: Internal Medicine | Admitting: Internal Medicine

## 2023-01-20 DIAGNOSIS — S61012A Laceration without foreign body of left thumb without damage to nail, initial encounter: Secondary | ICD-10-CM | POA: Diagnosis not present

## 2023-01-20 NOTE — Discharge Instructions (Addendum)
Daily wound dressing changes with antibiotic ointment After 48 to 72 hours it is okay for the wound to come into contact with water and mild soap. Please ensure that you wash the soap off completely.   Please do not immerse your finger in water over long periods of time.   No cooking until the wound is fully healed Please return to urgent care in 7 to 10 days for suture removal

## 2023-01-20 NOTE — ED Provider Notes (Signed)
[facility]    CSN: [id number] Arrival date & time: 01/20/23  1407      History   Chief Complaint Chief Complaint  Patient presents with   Extremity Laceration    Left thumb    HPI Claudia Crawford is a 42 y.o. female comes to the urgent care with laceration of left thumb whilst at work today.  Bleeding was controlled.  Patient's denies any numbness or tingling.  She has sensation of the left arm.  She is able to flex and extend her thumb.  HPI  History reviewed. No pertinent past medical history.  There are no problems to display for this patient.   Past Surgical History:  Procedure Laterality Date   CHOLECYSTECTOMY      OB History   No obstetric history on file.      Home Medications    Prior to Admission medications   Medication Sig Start Date End Date Taking? Authorizing Provider  levonorgestrel (MIRENA) 20 MCG/24HR IUD 1 each by Intrauterine route once.   Yes [provider]  hydrochlorothiazide (HYDRODIURIL) 25 MG tablet Take 25 mg by mouth.    [provider]  hydroxypropyl methylcellulose / hypromellose (ISOPTO TEARS / GONIOVISC) 2.5 % ophthalmic solution Place 1 drop into the left eye 4 (four) times daily. 11/16/22   Shaune PollackIsaacs, Cameron, MD  hydrOXYzine (ATARAX/VISTARIL) 25 MG tablet Take 1 tablet (25 mg total) by mouth every 8 (eight) hours as needed. 01/12/21   Tommie Samsook, Jayce G, DO  ondansetron (ZOFRAN ODT) 4 MG disintegrating tablet Take 1 tablet (4 mg total) by mouth every 8 (eight) hours as needed for nausea or vomiting. 01/12/21   Tommie Samsook, Jayce G, DO  venlafaxine (EFFEXOR) 37.5 MG tablet Take 37.5 mg by mouth 2 (two) times daily.    [provider]    Family History History reviewed. No pertinent family history.  Social History Social History   Tobacco Use   Smoking status: Never   Smokeless tobacco: Never  Vaping Use   Vaping Use: Never used  Substance Use Topics   Alcohol use: Yes    Comment: social   Drug  use: Never     Allergies   Patient has no known allergies.   Review of Systems Review of Systems As per HPI  Physical Exam Triage Vital Signs ED Triage Vitals  Enc Vitals Group     BP 01/20/23 1419 (!) 152/111     Pulse Rate 01/20/23 1419 79     Resp 01/20/23 1419 14     Temp 01/20/23 1419 98.4 F (36.9 C)     Temp Source 01/20/23 1419 Oral     SpO2 01/20/23 1419 98 %     Weight 01/20/23 1417 230 lb (104.3 kg)     Height 01/20/23 1417 5\' 6"  (1.676 m)     Head Circumference --      Peak Flow --      Pain Score 01/20/23 1416 6     Pain Loc --      Pain Edu? --      Excl. in GC? --    No data found.  Updated Vital Signs BP (!) 152/111 (BP Location: Left Arm)   Pulse 79   Temp 98.4 F (36.9 C) (Oral)   Resp 14   Ht 5\' 6"  (1.676 m)   Wt 104.3 kg   SpO2 98%   BMI 37.12 kg/m   Visual Acuity Right Eye Distance:   Left Eye Distance:  Bilateral Distance:    Right Eye Near:   Left Eye Near:    Bilateral Near:     Physical Exam Vitals and nursing note reviewed.  Musculoskeletal:     Comments: 1 inch laceration on the palmar aspect of the left thumb.  Laceration is over the interphalangeal joint.  Joint capsule is not exposed.  Patient is able to flex and extend her left arm.  Capillary refill is less than 2 seconds.  No sensory deficit of the left thumb..      UC Treatments / Results  Labs (all labs ordered are listed, but only abnormal results are displayed) Labs Reviewed - No data to display  EKG   Radiology No results found.  Procedures Laceration Repair  Date/Time: 01/20/2023 3:10 PM  Performed by: Merrilee Jansky, MD Authorized by: Merrilee Jansky, MD   Consent:    Consent obtained:  Verbal   Consent given by:  Patient   Risks discussed:  Infection and need for additional repair   Alternatives discussed:  No treatment and delayed treatment Universal protocol:    Patient identity confirmed:  Verbally with patient Anesthesia:     Anesthesia method:  Local infiltration   Local anesthetic:  Lidocaine 1% w/o epi Laceration details:    Location:  Finger   Finger location:  L thumb   Length (cm):  2   Depth (mm):  3 Exploration:    Wound exploration: wound explored through full range of motion     Wound extent: no fascia violation noted, no foreign bodies/material noted and no vascular damage noted   Treatment:    Area cleansed with:  Shur-Clens   Amount of cleaning:  Standard   Visualized foreign bodies/material removed: no     Debridement:  None   Undermining:  None Skin repair:    Repair method:  Sutures   Suture size:  4-0   Suture material:  Prolene   Number of sutures:  3 Approximation:    Approximation:  Close Repair type:    Repair type:  Simple Post-procedure details:    Dressing:  Antibiotic ointment and non-adherent dressing   Procedure completion:  Tolerated well, no immediate complications  (including critical care time)  Medications Ordered in UC Medications - No data to display  Initial Impression / Assessment and Plan / UC Course  I have reviewed the triage vital signs and the nursing notes.  Pertinent labs & imaging results that were available during my care of the patient were reviewed by me and considered in my medical decision making (see chart for details).     1.  Laceration of the left thumb: Laceration repair completed 3 sutures placed Patient is advised to return to urgent care in 7 to 10 days for suture removal. Laceration care instructions given to the patient. Final Clinical Impressions(s) / UC Diagnoses   Final diagnoses:  Laceration of left thumb without foreign body without damage to nail, initial encounter     Discharge Instructions      Daily wound dressing changes with antibiotic ointment After 48 to 72 hours it is okay for the wound to come into contact with water and mild soap. Please ensure that you wash the soap off completely.   Please do not immerse  your finger in water over long periods of time.   No cooking until the wound is fully healed Please return to urgent care in 7 to 10 days for suture removal    ED Prescriptions  None    PDMP not reviewed this encounter.   Merrilee JanskyLamptey, Danashia Landers O, MD 01/20/23 [phone]

## 2023-01-20 NOTE — ED Triage Notes (Signed)
Patient states that she cut her left thumb on a knife while at work today.  Patient has laceration to her left thumb.  Patient has pressure dressing on it at this time.

## 2023-02-06 ENCOUNTER — Encounter: Payer: Self-pay | Admitting: Family Medicine

## 2023-02-06 ENCOUNTER — Ambulatory Visit: Payer: 59 | Admitting: Family Medicine

## 2023-02-06 VITALS — BP 170/110 | HR 78 | Ht 66.0 in | Wt 244.0 lb

## 2023-02-06 DIAGNOSIS — R748 Abnormal levels of other serum enzymes: Secondary | ICD-10-CM | POA: Diagnosis not present

## 2023-02-06 DIAGNOSIS — K2100 Gastro-esophageal reflux disease with esophagitis, without bleeding: Secondary | ICD-10-CM

## 2023-02-06 DIAGNOSIS — J309 Allergic rhinitis, unspecified: Secondary | ICD-10-CM | POA: Diagnosis not present

## 2023-02-06 DIAGNOSIS — E876 Hypokalemia: Secondary | ICD-10-CM | POA: Diagnosis not present

## 2023-02-06 DIAGNOSIS — I10 Essential (primary) hypertension: Secondary | ICD-10-CM

## 2023-02-06 DIAGNOSIS — G478 Other sleep disorders: Secondary | ICD-10-CM

## 2023-02-06 MED ORDER — AMLODIPINE BESYLATE 5 MG PO TABS: ORAL_TABLET | ORAL | 0 refills | Status: DC

## 2023-02-06 MED ORDER — PANTOPRAZOLE SODIUM 40 MG PO TBEC: 40.0000 mg | DELAYED_RELEASE_TABLET | Freq: Every day | ORAL | 1 refills | Status: AC

## 2023-02-06 NOTE — Assessment & Plan Note (Signed)
Worsened nighttime symptoms, throat tightness in the setting of chronic allergic rhinitis despite regular antihistamine and intranasal steroid usage.  Clinical concern for possible reflux etiology component.  - Start pantoprazole daily to assess response

## 2023-02-06 NOTE — Assessment & Plan Note (Signed)
Ongoing symptoms, worst at night despite OTC antihistamine and intranasal steroid regimen.  Additionally describes multiple nighttime awakenings.  Tympanic membranes, canals, nasal turbinates benign, significantly crowded oropharynx without erythema, no exudate.  -Referral to ENT for further evaluation and management

## 2023-02-06 NOTE — Progress Notes (Signed)
Primary Care / Sports Medicine Office Visit  Patient Information:  Patient ID: Claudia Crawford, female DOB: Nov 24, 1980 Age: 42 y.o. MRN: [id number]   Claudia Crawford is a pleasant 42 y.o. female presenting with the following:  Chief Complaint  Patient presents with   Establish Care    Vitals:   02/06/23 0954 02/06/23 0958  BP: (!) 180/120 (!) 170/110  Pulse: 78   SpO2: 98%    Vitals:   02/06/23 0954  Weight: 244 lb (110.7 kg)  Height:  (1.676 m)   Body mass index is 39.38 kg/m.  No results found.   Independent interpretation of notes and tests performed by another provider:   None  Procedures performed:   None  Pertinent History, Exam, Impression, and Recommendations:   Claudia Crawford was seen today for establish care.  Primary hypertension Assessment & Plan: Chronic, asymptomatic from a cardiopulmonary standpoint, has been compliant with hydrochlorothiazide.  Examination without JVD, no peripheral edema, positive S1-S2, regular rate and rhythm, clear lung fields throughout without wheezes, rales, rhonchi.  Plan as follows: - Repeat CMP due to hypokalemia and transaminitis - Close follow-up for risk stratification labs while fasting - Continue HCTZ 25 mg - Add amlodipine 5 mg x 2 weeks then titrate to 10 mg until return - Home sleep study ordered - Lifestyle measures encouraged - Close follow-up in 6 weeks for annual physical, BP evaluation  Orders: -     Comprehensive metabolic panel -     amLODIPine Besylate; Take 1 tablet (5 mg total) by mouth daily for 14 days, THEN 2 tablets (10 mg total) daily for 28 days.  Dispense: 70 tablet; Refill: 0  Hypokalemia -     Comprehensive metabolic panel  Abnormal transaminases -     Comprehensive metabolic panel  Chronic allergic rhinitis Assessment & Plan: Ongoing symptoms, worst at night despite OTC antihistamine and intranasal steroid regimen.  Additionally describes multiple nighttime  awakenings.  Tympanic membranes, canals, nasal turbinates benign, significantly crowded oropharynx without erythema, no exudate.  -Referral to ENT for further evaluation and management  Orders: -     Ambulatory referral to ENT  Gastroesophageal reflux disease with esophagitis without hemorrhage Assessment & Plan: Worsened nighttime symptoms, throat tightness in the setting of chronic allergic rhinitis despite regular antihistamine and intranasal steroid usage.  Clinical concern for possible reflux etiology component.  - Start pantoprazole daily to assess response  Orders: -     Pantoprazole Sodium; Take 1 tablet (40 mg total) by mouth daily.  Dispense: 30 tablet; Refill: 1  Awakens from sleep at night Assessment & Plan: Chronic, lack of restful sleeping, snoring reported.  Etiology can include sequela of allergic rhinitis, reflux, sleep apnea.  -Home sleep study ordered See additional assessment(s) for plan details.       Orders & Medications Meds ordered this encounter  Medications   pantoprazole (PROTONIX) 40 MG tablet    Sig: Take 1 tablet (40 mg total) by mouth daily.    Dispense:  30 tablet    Refill:  1   amLODipine (NORVASC) 5 MG tablet    Sig: Take 1 tablet (5 mg total) by mouth daily for 14 days, THEN 2 tablets (10 mg total) daily for 28 days.    Dispense:  70 tablet    Refill:  0   Orders Placed This Encounter  Procedures   HM PAP SMEAR   Comprehensive Metabolic Panel (CMET)   Ambulatory referral to ENT  Return in about 6 weeks (around 03/20/2023) for CPE.     Jerrol Banana, MD, [facility]   Primary Care Sports Medicine Primary Care and Sports Medicine at [facility]

## 2023-02-06 NOTE — Assessment & Plan Note (Signed)
Chronic, asymptomatic from a cardiopulmonary standpoint, has been compliant with hydrochlorothiazide.  Examination without JVD, no peripheral edema, positive S1-S2, regular rate and rhythm, clear lung fields throughout without wheezes, rales, rhonchi.  Plan as follows: - Repeat CMP due to hypokalemia and transaminitis - Close follow-up for risk stratification labs while fasting - Continue HCTZ 25 mg - Add amlodipine 5 mg x 2 weeks then titrate to 10 mg until return - Home sleep study ordered - Lifestyle measures encouraged - Close follow-up in 6 weeks for annual physical, BP evaluation

## 2023-02-06 NOTE — Patient Instructions (Signed)
-   Obtain labs today - Take amlodipine 5 mg x 2 weeks then 10 mg daily - Take pantoprazole daily on empty stomach until return - You will be contacted to schedule ENT and for home sleep study - Return in 6 weeks for physical

## 2023-02-06 NOTE — Assessment & Plan Note (Signed)
Chronic, lack of restful sleeping, snoring reported.  Etiology can include sequela of allergic rhinitis, reflux, sleep apnea.  -Home sleep study ordered See additional assessment(s) for plan details.

## 2023-02-07 LAB — COMPREHENSIVE METABOLIC PANEL
ALT: 100 IU/L — ABNORMAL HIGH (ref 0–32)
AST: 64 IU/L — ABNORMAL HIGH (ref 0–40)
Albumin/Globulin Ratio: 1.4 (ref 1.2–2.2)
Albumin: 4 g/dL (ref 3.9–4.9)
Alkaline Phosphatase: 241 IU/L — ABNORMAL HIGH (ref 44–121)
BUN/Creatinine Ratio: 27 — ABNORMAL HIGH (ref 9–23)
BUN: 16 mg/dL (ref 6–20)
Bilirubin Total: 0.2 mg/dL (ref 0.0–1.2)
CO2: 20 mmol/L (ref 20–29)
Calcium: 9.4 mg/dL (ref 8.7–10.2)
Chloride: 101 mmol/L (ref 96–106)
Creatinine, Ser: 0.6 mg/dL (ref 0.57–1.00)
Globulin, Total: 2.8 g/dL (ref 1.5–4.5)
Glucose: 89 mg/dL (ref 70–99)
Potassium: 4 mmol/L (ref 3.5–5.2)
Sodium: 139 mmol/L (ref 134–144)
Total Protein: 6.8 g/dL (ref 6.0–8.5)
eGFR: 117 mL/min/{1.73_m2} (ref >59)

## 2023-02-20 ENCOUNTER — Other Ambulatory Visit: Payer: Self-pay | Admitting: Family Medicine

## 2023-02-20 DIAGNOSIS — I10 Essential (primary) hypertension: Secondary | ICD-10-CM

## 2023-02-21 NOTE — Telephone Encounter (Signed)
Requested medication (s) are due for refill today:   Provider to review  Requested medication (s) are on the active medication list:   Yes  Future visit scheduled:   Yes 03/20/2023 with Dr. Ashley Royalty   Last ordered: 02/06/2023 #70, 0 refills then dose is to increase to 10 mg.  Returned per protocol for a medication being titrated.     Requested Prescriptions  Pending Prescriptions Disp Refills   amLODipine (NORVASC) 5 MG tablet [Pharmacy Med Name: AMLODIPINE BESYLATE 5 MG TAB] 60 tablet 1    Sig: TAKE 1 TABLET BY MOUTH DAILY FOR 14 DAYS, THEN 2 TABLETS DAILY FOR 28 DAYS.     Cardiovascular: Calcium Channel Blockers 2 Failed - 02/20/2023  8:58 PM      Failed - Last BP in normal range    BP Readings from Last 1 Encounters:  02/06/23 (!) 170/110         Passed - Last Heart Rate in normal range    Pulse Readings from Last 1 Encounters:  02/06/23 78         Passed - Valid encounter within last 6 months    Recent Outpatient Visits           2 weeks ago Primary hypertension   [facility], Ocie Bob, MD       Future Appointments             In 3 weeks Ashley Royalty, Ocie Bob, MD [facility] Primary Care & [facility] Medicine at [facility], [facility]

## 2023-03-20 ENCOUNTER — Encounter: Payer: 59 | Admitting: Family Medicine

## 2023-03-27 ENCOUNTER — Encounter: Payer: Self-pay | Admitting: Family Medicine

## 2023-10-30 ENCOUNTER — Other Ambulatory Visit: Payer: Self-pay | Admitting: Obstetrics and Gynecology

## 2023-10-30 DIAGNOSIS — Z1231 Encounter for screening mammogram for malignant neoplasm of breast: Secondary | ICD-10-CM

## 2023-11-08 ENCOUNTER — Inpatient Hospital Stay: Admission: RE | Admit: 2023-11-08 | Payer: 59 | Source: Ambulatory Visit

## 2023-11-09 ENCOUNTER — Other Ambulatory Visit: Payer: Self-pay | Admitting: Family Medicine

## 2023-11-09 DIAGNOSIS — I10 Essential (primary) hypertension: Secondary | ICD-10-CM

## 2023-11-21 ENCOUNTER — Ambulatory Visit
Admission: RE | Admit: 2023-11-21 | Discharge: 2023-11-21 | Disposition: A | Discharge status: Home / Self Care | Payer: 59 | Source: Ambulatory Visit | Attending: Obstetrics and Gynecology | Admitting: Obstetrics and Gynecology

## 2023-11-21 DIAGNOSIS — Z1231 Encounter for screening mammogram for malignant neoplasm of breast: Secondary | ICD-10-CM | POA: Insufficient documentation

## 2023-11-24 ENCOUNTER — Other Ambulatory Visit: Payer: Self-pay | Admitting: Family Medicine

## 2023-11-24 DIAGNOSIS — I10 Essential (primary) hypertension: Secondary | ICD-10-CM

## 2024-01-03 ENCOUNTER — Ambulatory Visit: Payer: 59 | Admitting: Dietician
